# Patient Record
Sex: Female | Born: 1990 | Race: Asian | Hispanic: No | Marital: Single | Smoking: Current every day smoker
Health system: Southern US, Community
[De-identification: ages and names within clinical notes are randomized; demographics above are authoritative.]

---
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
## 2013-03-24 ENCOUNTER — Emergency Department (HOSPITAL_COMMUNITY): Payer: Self-pay

## 2013-03-24 ENCOUNTER — Encounter (HOSPITAL_COMMUNITY): Payer: Self-pay

## 2013-03-24 ENCOUNTER — Emergency Department (HOSPITAL_COMMUNITY)
Admission: EM | Admit: 2013-03-24 | Discharge: 2013-03-24 | Disposition: A | Discharge status: Home / Self Care | Payer: Self-pay | Attending: Emergency Medicine | Admitting: Emergency Medicine

## 2013-03-24 DIAGNOSIS — M79641 Pain in right hand: Secondary | ICD-10-CM

## 2013-03-24 DIAGNOSIS — W2209XA Striking against other stationary object, initial encounter: Secondary | ICD-10-CM | POA: Insufficient documentation

## 2013-03-24 DIAGNOSIS — S6990XA Unspecified injury of unspecified wrist, hand and finger(s), initial encounter: Secondary | ICD-10-CM | POA: Insufficient documentation

## 2013-03-24 DIAGNOSIS — F17200 Nicotine dependence, unspecified, uncomplicated: Secondary | ICD-10-CM | POA: Insufficient documentation

## 2013-03-24 DIAGNOSIS — Y929 Unspecified place or not applicable: Secondary | ICD-10-CM | POA: Insufficient documentation

## 2013-03-24 DIAGNOSIS — Y9383 Activity, rough housing and horseplay: Secondary | ICD-10-CM | POA: Insufficient documentation

## 2013-03-24 NOTE — ED Provider Notes (Signed)
CSN: [id number]     Arrival date & time 03/24/13  4098 History   First MD Initiated Contact with Patient 03/24/13 1023     Chief Complaint  Patient presents with  . Hand Pain   (Consider location/radiation/quality/duration/timing/severity/associated sxs/prior Treatment) HPI Comments: Patient is a 22 year old female who presents today with right hand pain after punching her friend did while play fighting yesterday. She reports that the pain is sharp and radiates into her forearm. Pain is worse with movement and palpation. She has tried elevating and icing her hand for approximately 5 minutes. She is right-hand dominant. She denies numbness, weakness, paresthesias, fever, chills, nausea, vomiting. She reports that she did not get hit in the head or lose consciousness while she was play fighting. She has no other complaints besides the sharp pain.  Patient is a 22 y.o. female presenting with hand pain. The history is provided by the patient. No language interpreter was used.  Hand Pain Associated symptoms include arthralgias, joint swelling and myalgias. Pertinent negatives include no abdominal pain, chest pain, chills, fever, nausea or vomiting.    History reviewed. No pertinent past medical history. History reviewed. No pertinent past surgical history. History reviewed. No pertinent family history. History  Substance Use Topics  . Smoking status: Current Every Day Smoker -- 2.00 packs/day    Types: Cigars  . Smokeless tobacco: Never Used  . Alcohol Use: No   OB History   Grav Para Term Preterm Abortions TAB SAB Ect Mult Living                 Review of Systems  Constitutional: Negative for fever and chills.  Respiratory: Negative for shortness of breath.   Cardiovascular: Negative for chest pain.  Gastrointestinal: Negative for nausea, vomiting and abdominal pain.  Musculoskeletal: Positive for myalgias, joint swelling and arthralgias.  All other systems reviewed and are  negative.    Allergies  Review of patient's allergies indicates no known allergies.  Home Medications  No current outpatient prescriptions on file. BP 113/65  Pulse 99  Temp(Src) 98.4 F (36.9 C) (Oral)  Resp 16  SpO2 100%  LMP 03/24/2013 Physical Exam  Nursing note and vitals reviewed. Constitutional: She is oriented to person, place, and time. She appears well-developed and well-nourished. No distress.  HENT:  Head: Normocephalic and atraumatic.  Right Ear: External ear normal.  Left Ear: External ear normal.  Nose: Nose normal.  Mouth/Throat: Oropharynx is clear and moist.  Eyes: Conjunctivae are normal.  Neck: Normal range of motion.  Cardiovascular: Normal rate, regular rhythm, normal heart sounds, intact distal pulses and normal pulses.   Cap refill < 3 seconds in all fingers  Pulmonary/Chest: Effort normal and breath sounds normal. No stridor. No respiratory distress. She has no wheezes. She has no rales.  Abdominal: Soft. She exhibits no distension. There is no tenderness.  Musculoskeletal: Normal range of motion.  Bruising and tenderness to the second MCP. Neurovascularly intact. Compartment soft.  Neurological: She is alert and oriented to person, place, and time. She has normal strength. No sensory deficit.  Grip strength 5/5 bilaterally  Skin: Skin is warm and dry. She is not diaphoretic. No erythema.  Psychiatric: She has a normal mood and affect. Her behavior is normal.    ED Course  Procedures (including critical care time) Labs Review Labs Reviewed - No data to display Imaging Review Dg Hand Complete Right  03/24/2013   CLINICAL DATA:  Injury. Pain index finger  EXAM: RIGHT HAND -  COMPLETE 3+ VIEW  COMPARISON:  None.  FINDINGS: There is no evidence of fracture or dislocation. There is no evidence of arthropathy or other focal bone abnormality. Soft tissues are unremarkable.  IMPRESSION: Negative.   Electronically Signed   By: Marlan Palau M.D.   On:  03/24/2013 11:44    MDM   1. Hand joint pain, right    Imaging shows no fracture. Directed pt to ice injury, take acetaminophen or ibuprofen for pain, and to elevate and rest the injury when possible. Neurovascularly intact. Compartment soft. Strength WNL. Return instructions given. Vital signs stable for discharge. Patient / Family / Caregiver informed of clinical course, understand medical decision-making process, and agree with plan.       Mora Bellman, PA-C 03/24/13 1155

## 2013-03-24 NOTE — ED Notes (Signed)
Patient reports that she was play fighting yesterday and hit a person. Patient is now c/o right hand pain. No edema or bruising noted.

## 2013-03-24 NOTE — Progress Notes (Signed)
[facility] provided pt with a list of primary care resources.  °

## 2013-03-24 NOTE — ED Provider Notes (Signed)
Medical screening examination/treatment/procedure(s) were performed by non-physician practitioner and as supervising physician I was immediately available for consultation/collaboration.    Sharifah Champine R Aireanna Luellen, MD 03/24/13 1613 

## 2014-08-26 ENCOUNTER — Encounter (HOSPITAL_COMMUNITY): Payer: Self-pay

## 2014-08-26 ENCOUNTER — Emergency Department (HOSPITAL_COMMUNITY)
Admission: EM | Admit: 2014-08-26 | Discharge: 2014-08-26 | Disposition: A | Discharge status: Home / Self Care | Payer: Self-pay | Attending: Emergency Medicine | Admitting: Emergency Medicine

## 2014-08-26 DIAGNOSIS — Z720 Tobacco use: Secondary | ICD-10-CM | POA: Insufficient documentation

## 2014-08-26 DIAGNOSIS — G5601 Carpal tunnel syndrome, right upper limb: Secondary | ICD-10-CM | POA: Insufficient documentation

## 2014-08-26 DIAGNOSIS — Z791 Long term (current) use of non-steroidal anti-inflammatories (NSAID): Secondary | ICD-10-CM | POA: Insufficient documentation

## 2014-08-26 MED ORDER — NAPROXEN 500 MG PO TABS: 500.0000 mg | ORAL_TABLET | Freq: Two times a day (BID) | ORAL | Status: DC

## 2014-08-26 NOTE — ED Notes (Signed)
Patient reports she works at a [employer] and noticed one month ago that she was having tingling in her fingers on her right hand.  Reports that she also has pain in right forearm area.  Worse with movement/working.

## 2014-08-26 NOTE — ED Provider Notes (Signed)
CSN: [id number]     Arrival date & time 08/26/14  [id number] History  This chart was scribed for non-physician practitioner, Lawana ChambersWilliam Duncan Thalia Turkington, PA-C,  working with Richardean Canalavid H Yao, MD, by Modena JanskyAlbert Thayil, ED Scribe. This patient was seen in room WTR7/WTR7 and the patient's care was started at 7:57 PM.   Chief Complaint  Patient presents with  . Arm Pain   The history is provided by the patient. No language interpreter was used.   HPI Comments: Courtney DubinKieu Mejia is a 24 y.o. 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 She states that she started having intermittent numbness and tingling in her right fingers that has become constant over the past few days. She reports this is worse with working and in the morning. She reports no known trauma. She reports that exertion exacerbates her symptoms. She reports lots of repetitive motion as she works at a [employer]. She reports tingling in the distal tips of fingers #2,3 &4. She states that she took some medication with some relief. She reports that she is right handed. She denies any fever, chills, or numbness or tingling in other parts of her body.    History reviewed. No pertinent past medical history. History reviewed. No pertinent past surgical history. History reviewed. No pertinent family history. History  Substance Use Topics  . Smoking status: Current Every Day Smoker -- 2.00 packs/day    Types: Cigars  . Smokeless tobacco: Never Used  . Alcohol Use: No   OB History    No data available     Review of Systems  Constitutional: Negative for fever and chills.  Musculoskeletal: Positive for myalgias.       Right wrist pain.   Skin: Negative for rash and wound.  Neurological: Positive for numbness. Negative for weakness.    Allergies  Review of patient's allergies indicates no known allergies.  Home Medications   Prior to  Admission medications   Medication Sig Start Date End Date Taking? Authorizing Provider  naproxen (NAPROSYN) 500 MG tablet Take 1 tablet (500 mg total) by mouth 2 (two) times daily with a meal. 08/26/14   Einar GipWilliam Duncan Antwain Caliendo, PA-C   BP 128/69 mmHg  Pulse 102  Temp(Src) 98.2 F (36.8 C) (Oral)  Resp 16  Ht 4\' 11"  (1.499 m)  Wt 145 lb (65.772 kg)  BMI 29.27 kg/m2  SpO2 100%  LMP 08/05/2014 (Approximate) Physical Exam  Constitutional: She is oriented to person, place, and time. She appears well-developed and well-nourished. No distress.  HENT:  Head: Normocephalic and atraumatic.  Eyes: Right eye exhibits no discharge. Left eye exhibits no discharge.  Neck: Neck supple. No tracheal deviation present.  Cardiovascular: Normal rate, regular rhythm, normal heart sounds and intact distal pulses.  Exam reveals no gallop and no friction rub.   No murmur heard. Heart rate is 88. Bilateral radial pulses are intact. Capillary refill is less than 2 seconds in her bilateral hands.  Pulmonary/Chest: Effort normal. No respiratory distress.  Musculoskeletal: Normal range of motion. She exhibits no edema.  Positive Tinel's sign and Phalen's test in her right wrist. No thenar atrophy. Sensation is intact in her distal fingers. Right wrist has normal and full range of motion. There is no deformity, edema or erythema noted.  Neurological: She is alert and oriented to person, place, and time. Coordination normal.  Sensation is intact  in her distal hands.  Skin: Skin is warm and dry. No rash noted. She is not diaphoretic. No erythema. No pallor.  Psychiatric: She has a normal mood and affect. Her behavior is normal.  Nursing note and vitals reviewed.   ED Course  Procedures (including critical care time) DIAGNOSTIC STUDIES: Oxygen Saturation is 100% on RA, normal by my interpretation.    COORDINATION OF CARE: 8:01 PM- Pt advised of plan for treatment which includes medication and pt agrees.  Labs  Review Labs Reviewed - No data to display  Imaging Review No results found.   EKG Interpretation None      Filed Vitals:   08/26/14 1923  BP: 128/69  Pulse: 102  Temp: 98.2 F (36.8 C)  TempSrc: Oral  Resp: 16  Height:  (1.499 m)  Weight: 145 lb (65.772 kg)  SpO2: 100%     MDM   Meds given in ED:  Medications - No data to display  Discharge Medication List as of 08/26/2014  8:06 PM    START taking these medications   Details  naproxen (NAPROSYN) 500 MG tablet Take 1 tablet (500 mg total) by mouth 2 (two) times daily with a meal., Starting 08/26/2014, Until Discontinued, Print        Final diagnoses:  Carpal tunnel syndrome of right wrist   This is a 24 y.o. 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 She complains of tingling in her right distal fingertips in digits #2, 3 and 4. Patient is afebrile and nontoxic appearing. She has a positive Phalen's test. Sensation is intact in her bilateral hands. Patient has symptoms consistent with carpal tunnel syndrome. Education provided on carpal tunnel. Patient given wrist splint and prescription for Naprosyn. I advised the patient to follow-up with their primary care provider this week. I advised the patient to return to the emergency department with new or worsening symptoms or new concerns. The patient verbalized understanding and agreement with plan.   I personally performed the services described in this documentation, which was scribed in my presence. The recorded information has been reviewed and is accurate.      Lawana Chambers, PA-C 08/26/14 2119  Richardean Canal, MD 08/26/14 [phone]

## 2014-08-26 NOTE — Discharge Instructions (Signed)
Carpal Tunnel Syndrome °Carpal tunnel syndrome is a disorder of the nervous system in the wrist that causes pain, hand weakness, and/or loss of feeling. Carpal tunnel syndrome is caused by the compression, stretching, or irritation of the median nerve at the wrist joint. Athletes who experience carpal tunnel syndrome may notice a decrease in their performance to the condition, especially for sports that require strong hand or wrist action.  °SYMPTOMS  °· Tingling, numbness, or burning pain in the hand or fingers. °· Inability to sleep due to pain in the hand. °· Sharp pains that shoot from the wrist up the arm or to the fingers, especially at night. °· Morning stiffness or cramping of the hand. °· Thumb weakness, resulting in difficulty holding objects or making a fist. °· Shiny, dry skin on the hand. °· Reduced performance in any sport requiring a strong grip. °CAUSES  °· Median nerve damage at the wrist is caused by pressure due to swelling, inflammation, or scarred tissue. °· Sources of pressure include: °¨ Repetitive gripping or squeezing that causes inflammation of the tendon sheaths. °¨ Scarring or shortening of the ligament that covers the median nerve. °¨ Traumatic injury to the wrist or forearm such as fracture, sprain, or dislocation. °¨ Prolonged hyperextension (wrist bent backward) or hyperflexion (wrist bent downward) of the wrist. °RISK INCREASES WITH: °· Diabetes mellitus. °· Menopause or amenorrhea. °· Rheumatoid arthritis. °· Raynaud disease. °· Pregnancy. °· Gout. °· Kidney disease. °· Ganglion cyst. °· Repetitive hand or wrist action. °· Hypothyroidism (underactive thyroid gland). °· Repetitive jolting or shaking of the hands or wrist. °· Prolonged forceful weight-bearing on the hands. °PREVENTION °· Bracing the hand and wrist straight during activities that involve repetitive grasping. °· For activities that require prolonged extension of the wrist (bending towards the top of the forearm)  periodically change the position of your wrists. °· Learn and use proper technique in activities that result in the wrist position in neutral to slight extension. °· Avoid bending the wrist into full extension or flexion (up or down). °· Keep the wrist in a straight (neutral) position. To keep the wrist in this position, wear a splint. °· Avoid repetitive hand and wrist motions. °· When possible avoid prolonged grasping of items (steering wheel of a car, a pen, a vacuum cleaner, or a rake). °· Loosen your grip for activities that require prolonged grasping of items. °· Place keyboards and writing surfaces at the correct height as to decrease strain on the wrist and hand. °· Alternate work tasks to avoid prolonged wrist flexion. °· Avoid pinching activities (needlework and writing) as they may irritate your carpal tunnel syndrome. °· If these activities are necessary, complete them for shorter periods of time. °· When writing, use a felt tip or rollerball pen and/or build up the grip on a pen to decrease the forces required for writing. °PROGNOSIS  °Carpal tunnel syndrome is usually curable with appropriate conservative treatment and sometimes resolves spontaneously. For some cases, surgery is necessary, especially if muscle wasting or nerve changes have developed.  °RELATED COMPLICATIONS  °· Permanent numbness and a weak thumb or fingers in the affected hand. °· Permanent paralysis of a portion of the hand and fingers. °TREATMENT  °Treatment initially consists of stopping activities that aggravate the symptoms as well as medication and ice to reduce inflammation. A wrist splint is often recommended for wear during activities of repetitive motion as well as at night. It is also important to learn and use proper technique when   performing activities that typically cause pain. On occasion, a corticosteroid injection may be given. °If symptoms persist despite conservative treatment, surgery may be an option. Surgical  techniques free the pinched or compressed nerve. Carpal tunnel surgery is usually performed on an outpatient basis, meaning you go home the same day as surgery. These procedures provide almost complete relief of all symptoms in 95% of patients. Expect at least 2 weeks for healing after surgery. For cases that are the result of repeated jolting or shaking of the hand or wrist or prolonged hyperextension, surgery is not usually recommended because stretching of the median nerve, not compression, is usually the cause of carpal tunnel syndrome in these cases. °MEDICATION  °· If pain medication is necessary, nonsteroidal anti-inflammatory medications, such as aspirin and ibuprofen, or other minor pain relievers, such as acetaminophen, are often recommended. °· Do not take pain medication for 7 days before surgery. °· Prescription pain relievers are usually only prescribed after surgery. Use only as directed and only as much as you need. °· Corticosteroid injections may be given to reduce inflammation. However, they are not always recommended. °· Vitamin B6 (pyridoxine) may reduce symptoms; use only if prescribed for your disorder. °SEEK MEDICAL CARE IF:  °· Symptoms get worse or do not improve in 2 weeks despite treatment. °· You also have a current or recent history of neck or shoulder injury that has resulted in pain or tingling elsewhere in your arm. °Document Released: 06/11/2005 Document Revised: 10/26/2013 Document Reviewed: 09/23/2008 °[employer]® Patient Information ©2015 [employer]. This information is not intended to replace advice given to you by your health care provider. Make sure you discuss any questions you have with your health care provider. ° °

## 2014-09-16 ENCOUNTER — Encounter (HOSPITAL_COMMUNITY): Payer: Self-pay | Admitting: Emergency Medicine

## 2014-09-16 ENCOUNTER — Emergency Department (HOSPITAL_COMMUNITY)
Admission: EM | Admit: 2014-09-16 | Discharge: 2014-09-16 | Disposition: A | Discharge status: Home / Self Care | Payer: Self-pay | Attending: Emergency Medicine | Admitting: Emergency Medicine

## 2014-09-16 DIAGNOSIS — Z791 Long term (current) use of non-steroidal anti-inflammatories (NSAID): Secondary | ICD-10-CM | POA: Insufficient documentation

## 2014-09-16 DIAGNOSIS — Z3202 Encounter for pregnancy test, result negative: Secondary | ICD-10-CM | POA: Insufficient documentation

## 2014-09-16 DIAGNOSIS — K625 Hemorrhage of anus and rectum: Secondary | ICD-10-CM | POA: Insufficient documentation

## 2014-09-16 DIAGNOSIS — Z720 Tobacco use: Secondary | ICD-10-CM | POA: Insufficient documentation

## 2014-09-16 DIAGNOSIS — R109 Unspecified abdominal pain: Secondary | ICD-10-CM

## 2014-09-16 LAB — CBC
HEMATOCRIT: 42 % (ref 36.0–46.0)
HEMOGLOBIN: 13.5 g/dL (ref 12.0–15.0)
MCH: 27.9 pg (ref 26.0–34.0)
MCHC: 32.1 g/dL (ref 30.0–36.0)
MCV: 86.8 fL (ref 78.0–100.0)
Platelets: 322 10*3/uL (ref 150–400)
RBC: 4.84 MIL/uL (ref 3.87–5.11)
RDW: 12.9 % (ref 11.5–15.5)
WBC: 8.4 10*3/uL (ref 4.0–10.5)

## 2014-09-16 LAB — PREGNANCY, URINE: PREG TEST UR: NEGATIVE

## 2014-09-16 LAB — COMPREHENSIVE METABOLIC PANEL
ALT: 19 U/L (ref 0–35)
ANION GAP: 10 (ref 5–15)
AST: 22 U/L (ref 0–37)
Albumin: 4.4 g/dL (ref 3.5–5.2)
Alkaline Phosphatase: 69 U/L (ref 39–117)
BILIRUBIN TOTAL: 0.6 mg/dL (ref 0.3–1.2)
BUN: 12 mg/dL (ref 6–23)
CALCIUM: 9.4 mg/dL (ref 8.4–10.5)
CHLORIDE: 106 mmol/L (ref 96–112)
CO2: 25 mmol/L (ref 19–32)
CREATININE: 0.73 mg/dL (ref 0.50–1.10)
GLUCOSE: 92 mg/dL (ref 70–99)
Potassium: 3.8 mmol/L (ref 3.5–5.1)
Sodium: 141 mmol/L (ref 135–145)
Total Protein: 7.8 g/dL (ref 6.0–8.3)

## 2014-09-16 LAB — URINALYSIS, ROUTINE W REFLEX MICROSCOPIC
Bilirubin Urine: NEGATIVE
GLUCOSE, UA: NEGATIVE mg/dL
KETONES UR: NEGATIVE mg/dL
NITRITE: NEGATIVE
PROTEIN: NEGATIVE mg/dL
Specific Gravity, Urine: 1.022 (ref 1.005–1.030)
UROBILINOGEN UA: 0.2 mg/dL (ref 0.0–1.0)
pH: 6 (ref 5.0–8.0)

## 2014-09-16 LAB — URINE MICROSCOPIC-ADD ON

## 2014-09-16 MED ORDER — OXYCODONE-ACETAMINOPHEN 5-325 MG PO TABS
1.0000 | ORAL_TABLET | Freq: Once | ORAL | Status: AC
  Administered 2014-09-16: 1 via ORAL
  Filled 2014-09-16: qty 1

## 2014-09-16 MED ORDER — NAPROXEN 500 MG PO TABS: 500.0000 mg | ORAL_TABLET | Freq: Two times a day (BID) | ORAL | Status: AC

## 2014-09-16 NOTE — ED Notes (Signed)
Pt c/o sharp lower abdominal pain x 3 weeks, rectal bleeding x 3 days, large clots passed from rectum 3 days ago, since changed to steady bright red blood. Pt denies dizziness.

## 2014-09-16 NOTE — ED Provider Notes (Signed)
CSN: [id number]     Arrival date & time 09/16/14  [id number] History   First MD Initiated Contact with Patient 09/16/14 [id number]     No chief complaint on file.    HPI Patient reports mild lower abdominal pain that is sharp in nature across the lower abdomen for the past several weeks.  She's never had discomfort of pain like this before.  She reports that she initially had some blood clots that she passed a reports this is significantly improved.  No rectal bleeding today.  Denies nausea vomiting.  No fevers or chills.  No family history of inflammatory bowel disease patient has no history of prior similar symptoms.  Pain is mild in severity.  She reports the pain is sharp in nature.  She reports a mild low back pain as well.  No dysuria or urinary frequency.  Denies vaginal discharge or pain with intercourse.  No other significant complaints.  She has not seen anyone about this problem to date   History reviewed. No pertinent past medical history. History reviewed. No pertinent past surgical history. No family history on file. History  Substance Use Topics  . Smoking status: Current Every Day Smoker -- 2.00 packs/day    Types: Cigars  . Smokeless tobacco: Never Used  . Alcohol Use: No   OB History    No data available     Review of Systems  All other systems reviewed and are negative.     Allergies  Review of patient's allergies indicates no known allergies.  Home Medications   Prior to Admission medications   Medication Sig Start Date End Date Taking? Authorizing Provider  naproxen (NAPROSYN) 500 MG tablet Take 1 tablet (500 mg total) by mouth 2 (two) times daily with a meal. 08/26/14   Everlene FarrierWilliam Dansie, PA-C   LMP 08/05/2014 (Approximate) Physical Exam  Constitutional: She is oriented to person, place, and time. She appears well-developed and well-nourished. No distress.  HENT:  Head: Normocephalic and atraumatic.  Eyes: EOM are normal.  Neck: Normal range of motion.   Cardiovascular: Normal rate, regular rhythm and normal heart sounds.   Pulmonary/Chest: Effort normal and breath sounds normal.  Abdominal: Soft. She exhibits no distension. There is no tenderness.  Genitourinary:  Rectal examination demonstrates no external hemorrhoids.  No gross blood.  No rectal masses noted.  Chaperone present.  Musculoskeletal: Normal range of motion.  Neurological: She is alert and oriented to person, place, and time.  Skin: Skin is warm and dry.  Psychiatric: She has a normal mood and affect. Judgment normal.  Nursing note and vitals reviewed.   ED Course  Procedures (including critical care time) Labs Review Labs Reviewed - No data to display  Imaging Review No results found.   EKG Interpretation None      MDM   Final diagnoses:  Abdominal pain, unspecified abdominal location  Rectal bleeding    Patient be referred to gastroenterology.  This could represent inflammatory bowel disease.  No focal tenderness on examination today.  Vital signs are normal.  Discharge home in good condition.  Patient feels better.  [facility]  She understands to return to the ER for new or worsening symptoms.    Azalia BilisKevin Jezlyn Westerfield, MD 09/16/14 [phone]

## 2014-09-16 NOTE — Discharge Instructions (Signed)

## 2015-10-08 ENCOUNTER — Emergency Department (HOSPITAL_COMMUNITY): Payer: BLUE CROSS/BLUE SHIELD

## 2015-10-08 ENCOUNTER — Encounter (HOSPITAL_COMMUNITY): Payer: Self-pay

## 2015-10-08 ENCOUNTER — Emergency Department (HOSPITAL_COMMUNITY)
Admission: EM | Admit: 2015-10-08 | Discharge: 2015-10-08 | Disposition: A | Discharge status: Home / Self Care | Payer: BLUE CROSS/BLUE SHIELD | Attending: Emergency Medicine | Admitting: Emergency Medicine

## 2015-10-08 DIAGNOSIS — M545 Low back pain: Secondary | ICD-10-CM | POA: Diagnosis present

## 2015-10-08 DIAGNOSIS — Z3202 Encounter for pregnancy test, result negative: Secondary | ICD-10-CM | POA: Diagnosis not present

## 2015-10-08 DIAGNOSIS — F17210 Nicotine dependence, cigarettes, uncomplicated: Secondary | ICD-10-CM | POA: Diagnosis not present

## 2015-10-08 DIAGNOSIS — N201 Calculus of ureter: Secondary | ICD-10-CM

## 2015-10-08 LAB — URINALYSIS, ROUTINE W REFLEX MICROSCOPIC
BILIRUBIN URINE: NEGATIVE
GLUCOSE, UA: NEGATIVE mg/dL
KETONES UR: NEGATIVE mg/dL
NITRITE: NEGATIVE
PH: 6 (ref 5.0–8.0)
Protein, ur: NEGATIVE mg/dL
Specific Gravity, Urine: 1.024 (ref 1.005–1.030)

## 2015-10-08 LAB — CBC
HEMATOCRIT: 41.4 % (ref 36.0–46.0)
Hemoglobin: 13.5 g/dL (ref 12.0–15.0)
MCH: 27.3 pg (ref 26.0–34.0)
MCHC: 32.6 g/dL (ref 30.0–36.0)
MCV: 83.6 fL (ref 78.0–100.0)
Platelets: 269 10*3/uL (ref 150–400)
RBC: 4.95 MIL/uL (ref 3.87–5.11)
RDW: 13 % (ref 11.5–15.5)
WBC: 10.5 10*3/uL (ref 4.0–10.5)

## 2015-10-08 LAB — COMPREHENSIVE METABOLIC PANEL
ALBUMIN: 4.5 g/dL (ref 3.5–5.0)
ALT: 28 U/L (ref 14–54)
AST: 27 U/L (ref 15–41)
Alkaline Phosphatase: 63 U/L (ref 38–126)
Anion gap: 10 (ref 5–15)
BILIRUBIN TOTAL: 0.6 mg/dL (ref 0.3–1.2)
BUN: 19 mg/dL (ref 6–20)
CO2: 24 mmol/L (ref 22–32)
Calcium: 9.6 mg/dL (ref 8.9–10.3)
Chloride: 105 mmol/L (ref 101–111)
Creatinine, Ser: 0.76 mg/dL (ref 0.44–1.00)
GFR calc Af Amer: >60 mL/min (ref >60)
GFR calc non Af Amer: >60 mL/min (ref >60)
GLUCOSE: 94 mg/dL (ref 65–99)
POTASSIUM: 4 mmol/L (ref 3.5–5.1)
Sodium: 139 mmol/L (ref 135–145)
TOTAL PROTEIN: 7.6 g/dL (ref 6.5–8.1)

## 2015-10-08 LAB — LIPASE, BLOOD: Lipase: 22 U/L (ref 11–51)

## 2015-10-08 LAB — URINE MICROSCOPIC-ADD ON

## 2015-10-08 LAB — POC URINE PREG, ED: Preg Test, Ur: NEGATIVE

## 2015-10-08 MED ORDER — ONDANSETRON HCL 4 MG PO TABS: 4.0000 mg | ORAL_TABLET | Freq: Four times a day (QID) | ORAL | Status: AC

## 2015-10-08 MED ORDER — OXYCODONE-ACETAMINOPHEN 5-325 MG PO TABS: 1.0000 | ORAL_TABLET | Freq: Four times a day (QID) | ORAL | Status: AC | PRN

## 2015-10-08 MED ORDER — TAMSULOSIN HCL 0.4 MG PO CAPS: 0.4000 mg | ORAL_CAPSULE | Freq: Every day | ORAL | Status: AC

## 2015-10-08 NOTE — ED Notes (Signed)
Pt presents with c/o abdominal pain and back pain for approx one year. Pt reports vomiting off and on for one year. Denies any injury to her back.

## 2015-10-08 NOTE — ED Provider Notes (Signed)
CSN: [id number]     Arrival date & time 10/08/15  0815 History   First MD Initiated Contact with Patient 10/08/15 [phone]     Chief Complaint  Patient presents with  . Abdominal Pain  . Back Pain     (Consider location/radiation/quality/duration/timing/severity/associated sxs/prior Treatment) HPI  Pt presenting with right sided low back pain and right sided flank pain which she states has been going on for the past 1-2 years- last night pain was worse than usual and this morning she had episode of emesis associated.  No abdominal pain- pain is in right side/flank.  No fever/chills.  No change in stools.  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  Pain is constant and aching in nature.  She has not had any treatment prior to arrival.  There are no other associated systemic symptoms, there are no other alleviating or modifying factors.   History reviewed. No pertinent past medical history. History reviewed. No pertinent past surgical history. No family history on file. Social History  Substance Use Topics  . Smoking status: Current Every Day Smoker -- 2.00 packs/day    Types: Cigars  . Smokeless tobacco: Never Used  . Alcohol Use: No   OB History    No data available     Review of Systems  ROS reviewed and all otherwise negative except for mentioned in HPI    Allergies  Review of patient's allergies indicates no known allergies.  Home Medications   Prior to Admission medications   Medication Sig Start Date End Date Taking? Authorizing Provider  acetaminophen (TYLENOL) 500 MG tablet Take 500 mg by mouth every 6 (six) hours as needed for mild pain or headache.   Yes Historical Provider, MD  naproxen (NAPROSYN) 500 MG tablet Take 1 tablet (500 mg total) by mouth 2 (two) times daily with a meal. Patient not taking: Reported on 10/08/2015 09/16/14   Azalia Bilis,  MD  ondansetron (ZOFRAN) 4 MG tablet Take 1 tablet (4 mg total) by mouth every 6 (six) hours. 10/08/15   Jerelyn Scott, MD  oxyCODONE-acetaminophen (PERCOCET/ROXICET) 5-325 MG tablet Take 1-2 tablets by mouth every 6 (six) hours as needed for severe pain. 10/08/15   Jerelyn Scott, MD  tamsulosin (FLOMAX) 0.4 MG CAPS capsule Take 1 capsule (0.4 mg total) by mouth daily. 10/08/15   Jerelyn Scott, MD   BP 120/66 mmHg  Pulse 92  Temp(Src) 98.3 F (36.8 C) (Oral)  Resp 16  SpO2 99%  LMP 10/01/2015 (Approximate)  Vitals reviewed Physical Exam  Physical Examination: General appearance - alert, well appearing, and in no distress Mental status - alert, oriented to person, place, and time Eyes - no conjunctival injection no scleral icterus Mouth - mucous membranes moist, pharynx normal without lesions Chest - clear to auscultation, no wheezes, rales or rhonchi, symmetric air entry Heart - normal rate, regular rhythm, normal S1, S2, no murmurs, rubs, clicks or gallops Abdomen - soft, nontender, nondistended, no masses or organomegaly Back exam - full range of motion, no tenderness, palpable spasm or pain on motion Neurological - alert, oriented, normal speech, strength 5/5 in extremities x 4, sensation intact Extremities - peripheral pulses normal, no pedal edema, no clubbing or cyanosis Skin - normal coloration and turgor, no rashes  ED Course  Procedures (including critical care time) Labs Review Labs Reviewed  URINALYSIS,  ROUTINE W REFLEX MICROSCOPIC (NOT AT [facility]) - Abnormal; Notable for the following:    APPearance CLOUDY (*)    Hgb urine dipstick LARGE (*)    Leukocytes, UA TRACE (*)    All other components within normal limits  URINE MICROSCOPIC-ADD ON - Abnormal; Notable for the following:    Squamous Epithelial / LPF 6-30 (*)    Bacteria, UA FEW (*)    All other components within normal limits  LIPASE, BLOOD  COMPREHENSIVE METABOLIC PANEL  CBC  POC URINE PREG, ED    Imaging  Review Ct Renal Stone Study  10/08/2015  CLINICAL DATA:  Right-sided flank pain with hematuria. Chronic abdominal and back pain for 1 year. EXAM: CT ABDOMEN AND PELVIS WITHOUT CONTRAST TECHNIQUE: Multidetector CT imaging of the abdomen and pelvis was performed following the standard protocol without IV contrast. COMPARISON:  None. FINDINGS: Lower chest: Clear lung bases. Normal heart size without pericardial or pleural effusion. Hepatobiliary: Normal liver. Normal gallbladder, without biliary ductal dilatation. Pancreas: Normal, without mass or ductal dilatation. Spleen: Normal in size, without focal abnormality. Adrenals/Urinary Tract: Normal adrenal glands. Punctate upper pole left renal collecting system calculus. Other foci of hyper attenuation in the left renal collecting system are favored to be related to concentrated urine. Suspicion of mild right perinephric edema. Although there is no significant hydroureter, a stone either at or just exited the right ureterovesicular junction measures 4 mm on image 68/series 2. Stomach/Bowel: Normal stomach, without wall thickening. Normal colon, appendix, and terminal ileum. Normal small bowel. Vascular/Lymphatic: Normal caliber of the aorta and branch vessels. No abdominopelvic adenopathy. Reproductive: Normal uterus and adnexa. Other: No significant free fluid. Musculoskeletal: Degenerate disc disease at the lumbosacral junction. IMPRESSION: 1. Right-sided stone either at or just exited the ureterovesicular junction. 2. Left nephrolithiasis. Electronically Signed   By: Jeronimo GreavesKyle  Talbot M.D.   On: 10/08/2015 12:53   I have personally reviewed and evaluated these images and lab results as part of my medical decision-making.   EKG Interpretation None      MDM   Final diagnoses:  Ureteral stone    Pt presenting with c/o right sided flank and right mid abdominal pain.  Pt has many RBCs in heru urine therefore CT scan obtained, evidence of right sided UVJ stone  on CT scan. Pts pain is reasonable in the ED will d/c with rx for percocet, zofran, flomax.  Given information for followup with urology.  All results d/w patient and she is agreeable with plan. Discharged with strict return precautions.  Pt agreeable with plan.    Jerelyn ScottMartha Linker, MD 10/08/15 [id number]

## 2015-10-08 NOTE — Discharge Instructions (Signed)
Return to the ED with any concerns including pain not controlled by pain medication, vomiting and not able to keep down liquids or medications, decreased level of alertness/lethargy, or any other alarming symptoms

## 2017-08-03 IMAGING — CT CT RENAL STONE PROTOCOL
2 of 3 series · 16 of 46 positions shown, 18 images · non-contrast
Comparison: None.

CLINICAL DATA: Right-sided flank pain with hematuria. Chronic
abdominal and back pain for 1 year.

EXAM:
CT ABDOMEN AND PELVIS WITHOUT CONTRAST
TECHNIQUE: Multidetector CT imaging of the abdomen and pelvis was performed
following the standard protocol without IV contrast.

[Series 3: coronal · coronal · 0.71mm/px · 3 of 145 slices shown]
[im 49/145  soft-tissue]
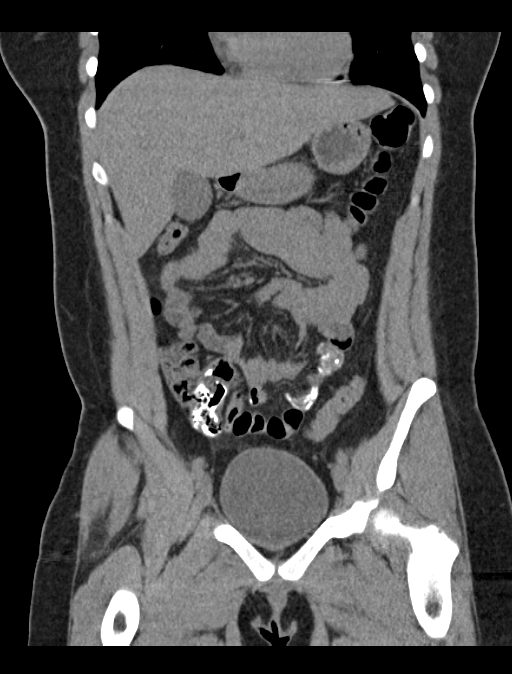
[im 65/145  soft-tissue]
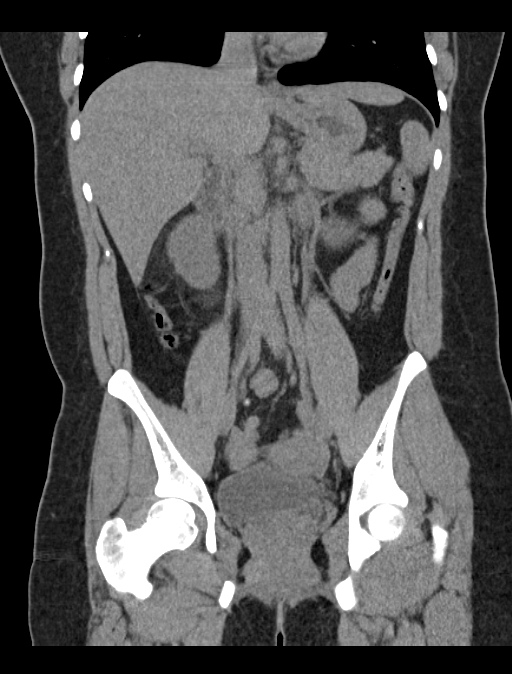
[im 81/145  soft-tissue]
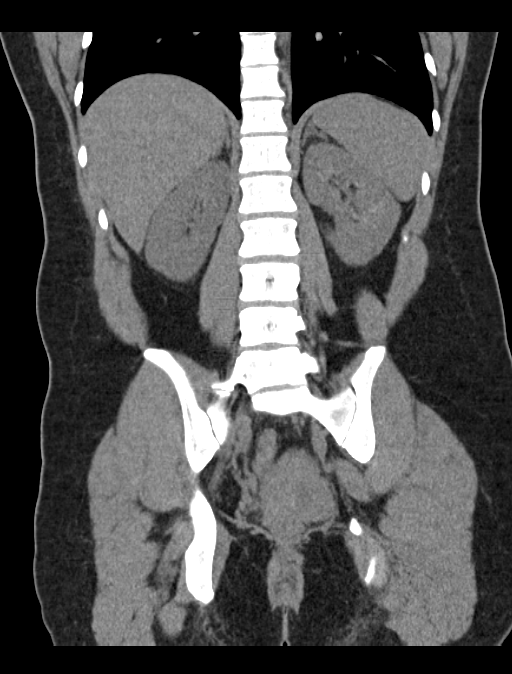

[Series 6: lung · axial · 0.76mm/px · z∈[-262,-174]mm · 13 of 35 slices shown, 15 images]
[im 3/35  soft-tissue]
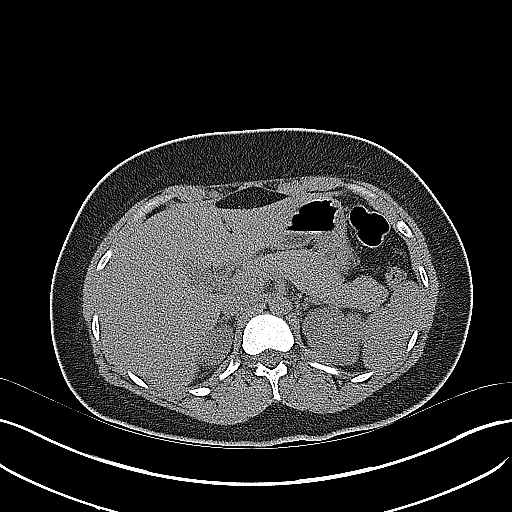
[im 3/35  bone]
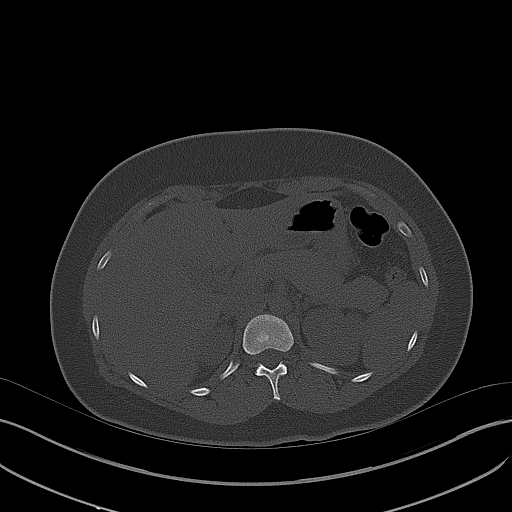
[im 5/35  soft-tissue]
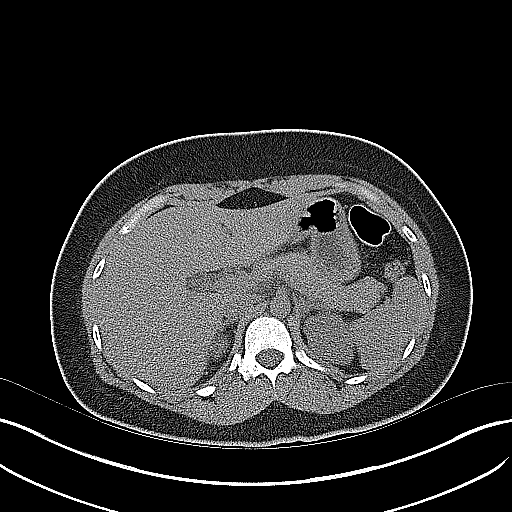
[im 7/35  soft-tissue]
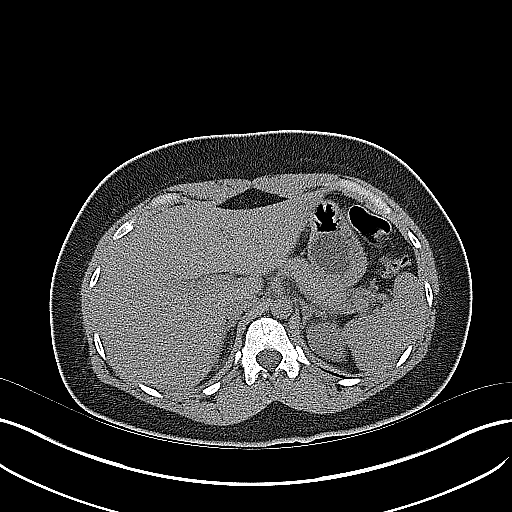
[im 10/35  soft-tissue]
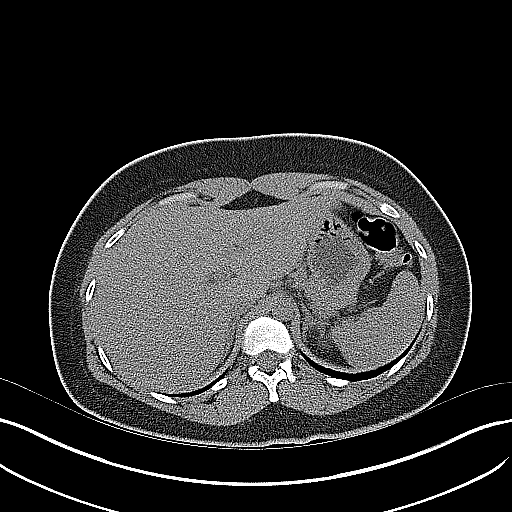
[im 13/35  soft-tissue]
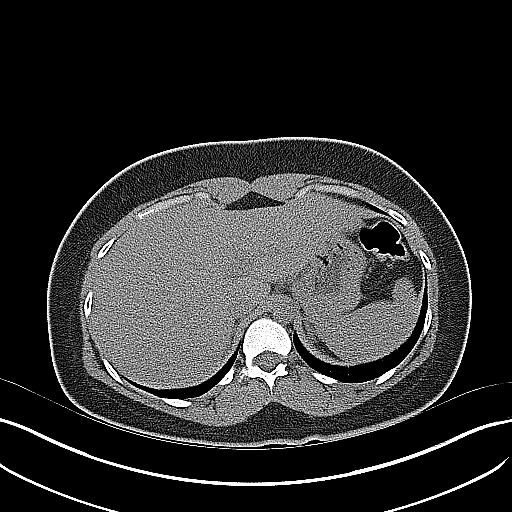
[im 15/35  soft-tissue]
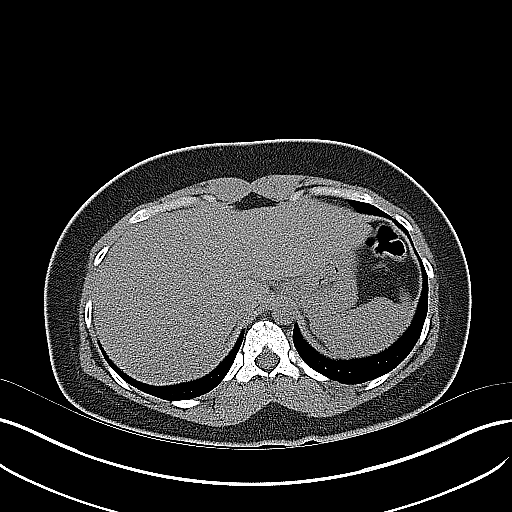
[im 18/35  soft-tissue]
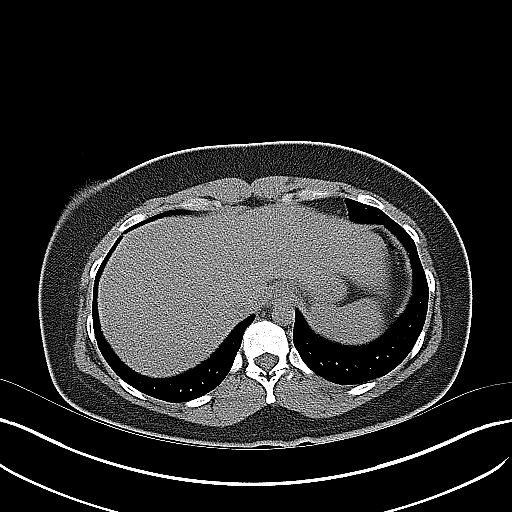
[im 20/35  soft-tissue]
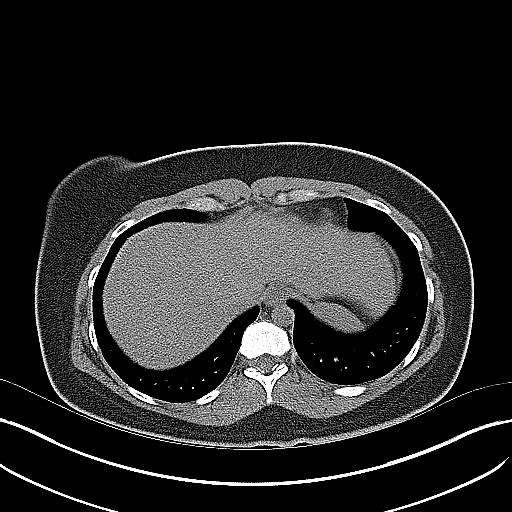
[im 22/35  soft-tissue]
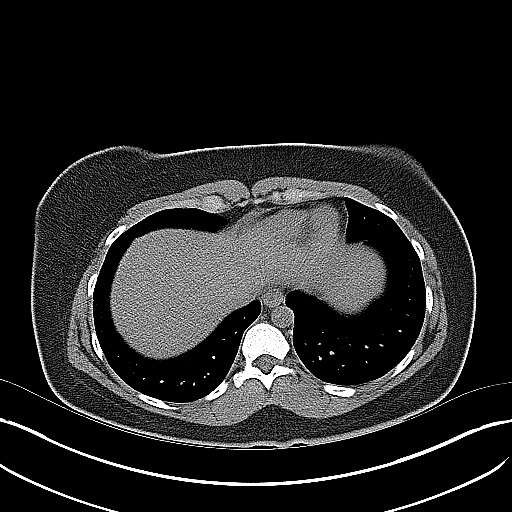
[im 22/35  bone]
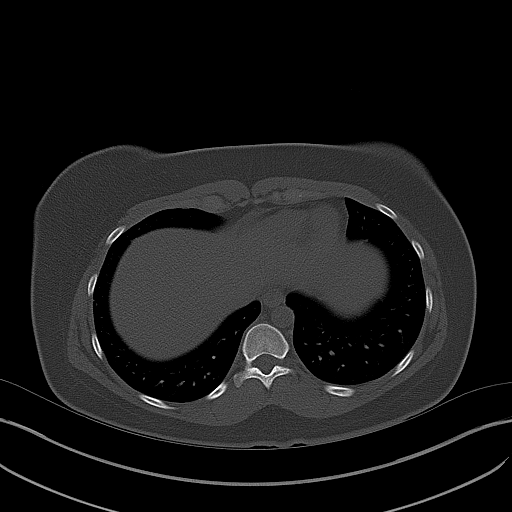
[im 25/35  soft-tissue]
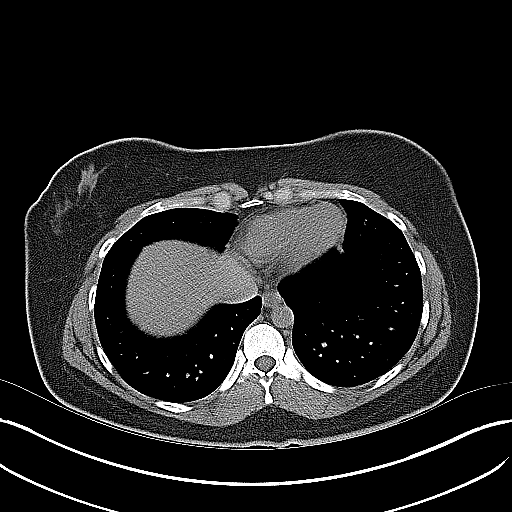
[im 28/35  soft-tissue]
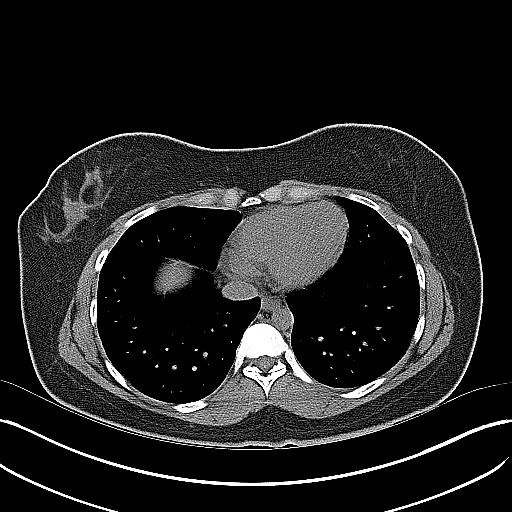
[im 30/35  soft-tissue]
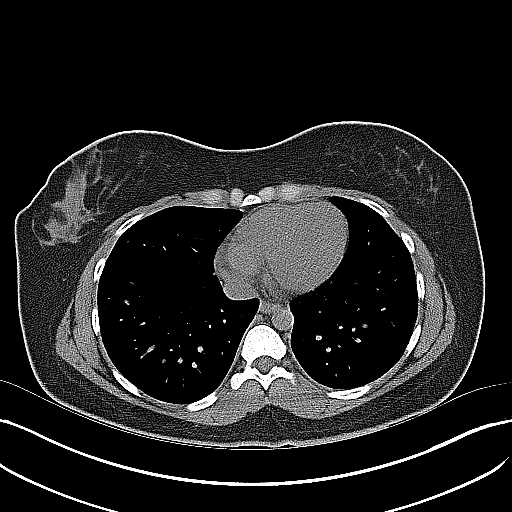
[im 32/35  soft-tissue]
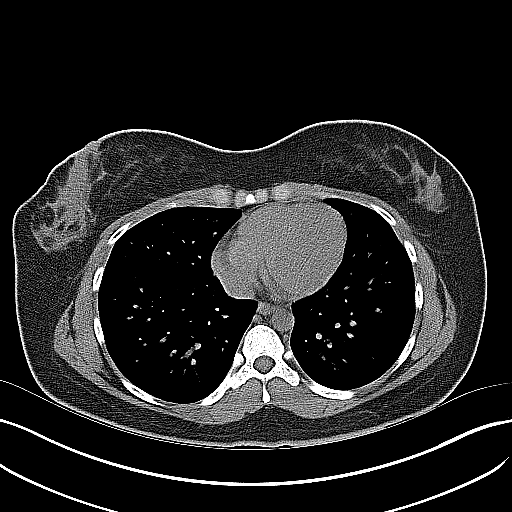

[16 of 46 positions shown; findings below may reference images not displayed]

FINDINGS: Lower chest: Clear lung bases. Normal heart size without pericardial
or pleural effusion.

Hepatobiliary: Normal liver. Normal gallbladder, without biliary
ductal dilatation.

Pancreas: Normal, without mass or ductal dilatation.

Spleen: Normal in size, without focal abnormality.

Adrenals/Urinary Tract: Normal adrenal glands. Punctate upper pole
left renal collecting system calculus. Other foci of hyper
attenuation in the left renal collecting system are favored to be
related to concentrated urine. Suspicion of mild right perinephric
edema. Although there is no significant hydroureter, a stone either
at or just exited the right ureterovesicular junction measures 4 mm
on image 68/series 2.

Stomach/Bowel: Normal stomach, without wall thickening. Normal
colon, appendix, and terminal ileum. Normal small bowel.

Vascular/Lymphatic: Normal caliber of the aorta and branch vessels.
No abdominopelvic adenopathy.

Reproductive: Normal uterus and adnexa.

Other: No significant free fluid.

Musculoskeletal: Degenerate disc disease at the lumbosacral
junction.
IMPRESSION: 1. Right-sided stone either at or just exited the ureterovesicular
junction.
2. Left nephrolithiasis.

## 2020-10-10 ENCOUNTER — Ambulatory Visit: Payer: Self-pay | Admitting: Internal Medicine
